# Patient Record
Sex: Female | Born: 1960 | Race: White | Hispanic: No | Marital: Married | Smoking: Former smoker
Health system: Southern US, Community
[De-identification: ages and names within clinical notes are randomized; demographics above are authoritative.]

## PROBLEM LIST (undated history)

## (undated) DIAGNOSIS — E785 Hyperlipidemia, unspecified: Secondary | ICD-10-CM

## (undated) DIAGNOSIS — C50919 Malignant neoplasm of unspecified site of unspecified female breast: Secondary | ICD-10-CM

## (undated) HISTORY — DX: Malignant neoplasm of unspecified site of unspecified female breast: C50.919

## (undated) HISTORY — DX: Hyperlipidemia, unspecified: E78.5

## (undated) HISTORY — PX: OTHER SURGICAL HISTORY: SHX169

## (undated) HISTORY — PX: TONSILLECTOMY: SUR1361

---

## 1994-05-23 HISTORY — PX: MASTECTOMY: SHX3

## 1998-06-25 ENCOUNTER — Encounter: Payer: Self-pay | Admitting: Internal Medicine

## 1998-06-25 ENCOUNTER — Ambulatory Visit (HOSPITAL_COMMUNITY): Admission: RE | Admit: 1998-06-25 | Discharge: 1998-06-25 | Payer: Self-pay | Admitting: Internal Medicine

## 1998-12-30 ENCOUNTER — Other Ambulatory Visit: Admission: RE | Admit: 1998-12-30 | Discharge: 1998-12-30 | Payer: Self-pay | Admitting: *Deleted

## 2000-01-17 ENCOUNTER — Other Ambulatory Visit: Admission: RE | Admit: 2000-01-17 | Discharge: 2000-01-17 | Payer: Self-pay | Admitting: *Deleted

## 2000-01-21 ENCOUNTER — Encounter (INDEPENDENT_AMBULATORY_CARE_PROVIDER_SITE_OTHER): Payer: Self-pay | Admitting: Specialist

## 2000-01-21 ENCOUNTER — Ambulatory Visit (HOSPITAL_COMMUNITY): Admission: RE | Admit: 2000-01-21 | Discharge: 2000-01-21 | Payer: Self-pay | Admitting: *Deleted

## 2001-03-21 ENCOUNTER — Other Ambulatory Visit: Admission: RE | Admit: 2001-03-21 | Discharge: 2001-03-21 | Payer: Self-pay | Admitting: *Deleted

## 2002-04-04 ENCOUNTER — Other Ambulatory Visit: Admission: RE | Admit: 2002-04-04 | Discharge: 2002-04-04 | Payer: Self-pay | Admitting: *Deleted

## 2003-05-29 ENCOUNTER — Other Ambulatory Visit: Admission: RE | Admit: 2003-05-29 | Discharge: 2003-05-29 | Payer: Self-pay | Admitting: *Deleted

## 2003-09-05 ENCOUNTER — Ambulatory Visit (HOSPITAL_COMMUNITY): Admission: RE | Admit: 2003-09-05 | Discharge: 2003-09-05 | Payer: Self-pay | Admitting: *Deleted

## 2003-09-05 ENCOUNTER — Encounter (INDEPENDENT_AMBULATORY_CARE_PROVIDER_SITE_OTHER): Payer: Self-pay | Admitting: Specialist

## 2005-07-13 ENCOUNTER — Ambulatory Visit: Payer: Self-pay | Admitting: Internal Medicine

## 2005-07-28 ENCOUNTER — Ambulatory Visit: Payer: Self-pay | Admitting: Internal Medicine

## 2006-02-23 ENCOUNTER — Ambulatory Visit: Payer: Self-pay | Admitting: Internal Medicine

## 2006-04-06 ENCOUNTER — Ambulatory Visit: Payer: Self-pay | Admitting: Internal Medicine

## 2006-05-05 ENCOUNTER — Ambulatory Visit: Payer: Self-pay | Admitting: Internal Medicine

## 2006-09-20 ENCOUNTER — Encounter: Admission: RE | Admit: 2006-09-20 | Discharge: 2006-09-20 | Payer: Self-pay | Admitting: Otolaryngology

## 2007-09-12 ENCOUNTER — Ambulatory Visit: Payer: Self-pay | Admitting: Family Medicine

## 2007-09-12 LAB — CONVERTED CEMR LAB
Inflenza A Ag: NEGATIVE
Rapid Strep: NEGATIVE

## 2008-02-12 ENCOUNTER — Ambulatory Visit: Payer: Self-pay | Admitting: Internal Medicine

## 2008-02-12 DIAGNOSIS — R61 Generalized hyperhidrosis: Secondary | ICD-10-CM | POA: Insufficient documentation

## 2008-02-12 DIAGNOSIS — R51 Headache: Secondary | ICD-10-CM | POA: Insufficient documentation

## 2008-02-12 DIAGNOSIS — R6883 Chills (without fever): Secondary | ICD-10-CM | POA: Insufficient documentation

## 2008-02-12 DIAGNOSIS — Z853 Personal history of malignant neoplasm of breast: Secondary | ICD-10-CM | POA: Insufficient documentation

## 2008-02-12 DIAGNOSIS — R519 Headache, unspecified: Secondary | ICD-10-CM | POA: Insufficient documentation

## 2008-02-12 LAB — CONVERTED CEMR LAB
Basophils Absolute: 0 10*3/uL (ref 0.0–0.1)
Basophils Relative: 0.7 % (ref 0.0–3.0)
Eosinophils Absolute: 0.1 10*3/uL (ref 0.0–0.7)
Eosinophils Relative: 2.6 % (ref 0.0–5.0)
HCT: 41.1 % (ref 36.0–46.0)
Hemoglobin: 14.4 g/dL (ref 12.0–15.0)
Lymphocytes Relative: 25.5 % (ref 12.0–46.0)
MCHC: 35.1 g/dL (ref 30.0–36.0)
MCV: 97 fL (ref 78.0–100.0)
Monocytes Absolute: 0.3 10*3/uL (ref 0.1–1.0)
Monocytes Relative: 6.9 % (ref 3.0–12.0)
Neutro Abs: 3.1 10*3/uL (ref 1.4–7.7)
Neutrophils Relative %: 64.3 % (ref 43.0–77.0)
Platelets: 273 10*3/uL (ref 150–400)
RBC: 4.23 M/uL (ref 3.87–5.11)
RDW: 12.3 % (ref 11.5–14.6)
WBC: 4.7 10*3/uL (ref 4.5–10.5)

## 2008-02-13 ENCOUNTER — Encounter: Payer: Self-pay | Admitting: Internal Medicine

## 2009-05-07 ENCOUNTER — Encounter: Payer: Self-pay | Admitting: Internal Medicine

## 2009-05-07 DIAGNOSIS — R0989 Other specified symptoms and signs involving the circulatory and respiratory systems: Secondary | ICD-10-CM

## 2009-05-07 DIAGNOSIS — R0609 Other forms of dyspnea: Secondary | ICD-10-CM

## 2009-05-13 ENCOUNTER — Telehealth (INDEPENDENT_AMBULATORY_CARE_PROVIDER_SITE_OTHER): Payer: Self-pay | Admitting: *Deleted

## 2009-05-13 ENCOUNTER — Ambulatory Visit: Payer: Self-pay | Admitting: Internal Medicine

## 2009-10-12 ENCOUNTER — Encounter: Admission: RE | Admit: 2009-10-12 | Discharge: 2009-10-12 | Payer: Self-pay | Admitting: Family Medicine

## 2010-02-11 ENCOUNTER — Encounter: Payer: Self-pay | Admitting: Internal Medicine

## 2010-02-12 ENCOUNTER — Encounter: Payer: Self-pay | Admitting: Internal Medicine

## 2010-02-15 ENCOUNTER — Encounter: Payer: Self-pay | Admitting: Internal Medicine

## 2010-02-15 ENCOUNTER — Encounter (INDEPENDENT_AMBULATORY_CARE_PROVIDER_SITE_OTHER): Payer: Self-pay | Admitting: *Deleted

## 2010-03-11 ENCOUNTER — Encounter (INDEPENDENT_AMBULATORY_CARE_PROVIDER_SITE_OTHER): Payer: Self-pay | Admitting: *Deleted

## 2010-03-15 ENCOUNTER — Ambulatory Visit: Payer: Self-pay | Admitting: Internal Medicine

## 2010-03-29 ENCOUNTER — Telehealth: Payer: Self-pay | Admitting: Gastroenterology

## 2010-03-29 ENCOUNTER — Ambulatory Visit: Payer: Self-pay | Admitting: Internal Medicine

## 2010-04-05 ENCOUNTER — Encounter: Payer: Self-pay | Admitting: Family Medicine

## 2010-04-06 ENCOUNTER — Ambulatory Visit: Payer: Self-pay | Admitting: Internal Medicine

## 2010-04-06 DIAGNOSIS — R5383 Other fatigue: Secondary | ICD-10-CM

## 2010-04-06 DIAGNOSIS — R5381 Other malaise: Secondary | ICD-10-CM

## 2010-04-08 LAB — CONVERTED CEMR LAB
Basophils Relative: 0.4 % (ref 0.0–3.0)
Eosinophils Relative: 0.9 % (ref 0.0–5.0)
HCT: 43.2 % (ref 36.0–46.0)
MCV: 96.7 fL (ref 78.0–100.0)
Monocytes Absolute: 0.5 10*3/uL (ref 0.1–1.0)
Monocytes Relative: 6.8 % (ref 3.0–12.0)
Neutrophils Relative %: 64 % (ref 43.0–77.0)
RBC: 4.47 M/uL (ref 3.87–5.11)
WBC: 7 10*3/uL (ref 4.5–10.5)

## 2010-04-09 ENCOUNTER — Telehealth: Payer: Self-pay | Admitting: Gastroenterology

## 2010-04-12 ENCOUNTER — Ambulatory Visit: Payer: Self-pay | Admitting: Internal Medicine

## 2010-04-21 ENCOUNTER — Encounter: Payer: Self-pay | Admitting: Internal Medicine

## 2010-04-22 ENCOUNTER — Encounter: Payer: Self-pay | Admitting: Internal Medicine

## 2010-04-23 ENCOUNTER — Ambulatory Visit: Payer: Self-pay | Admitting: Internal Medicine

## 2010-04-23 ENCOUNTER — Telehealth (INDEPENDENT_AMBULATORY_CARE_PROVIDER_SITE_OTHER): Payer: Self-pay | Admitting: *Deleted

## 2010-04-27 ENCOUNTER — Ambulatory Visit: Payer: Self-pay | Admitting: Internal Medicine

## 2010-04-27 ENCOUNTER — Encounter: Payer: Self-pay | Admitting: Family Medicine

## 2010-04-28 ENCOUNTER — Encounter: Payer: Self-pay | Admitting: Internal Medicine

## 2010-04-29 ENCOUNTER — Encounter: Payer: Self-pay | Admitting: Internal Medicine

## 2010-04-29 ENCOUNTER — Ambulatory Visit: Payer: Self-pay | Admitting: Internal Medicine

## 2010-04-30 ENCOUNTER — Encounter: Payer: Self-pay | Admitting: Internal Medicine

## 2010-04-30 ENCOUNTER — Ambulatory Visit: Payer: Self-pay | Admitting: Internal Medicine

## 2010-04-30 DIAGNOSIS — F411 Generalized anxiety disorder: Secondary | ICD-10-CM | POA: Insufficient documentation

## 2010-04-30 DIAGNOSIS — E785 Hyperlipidemia, unspecified: Secondary | ICD-10-CM | POA: Insufficient documentation

## 2010-05-05 ENCOUNTER — Ambulatory Visit: Payer: Self-pay | Admitting: Cardiology

## 2010-05-05 ENCOUNTER — Ambulatory Visit: Payer: Self-pay

## 2010-06-22 NOTE — Miscellaneous (Signed)
Summary: Orders Update   Clinical Lists Changes  Orders: Added new Referral order of Radiology Referral (Radiology) - Signed 

## 2010-06-22 NOTE — Miscellaneous (Signed)
Summary: [facility] Pervisit/prep  Clinical Lists Changes  Medications: Added new medication of MOVIPREP 100 GM  SOLR (PEG-KCL-NACL-NASULF-NA ASC-C) As per prep instructions. - Signed Rx of MOVIPREP 100 GM  SOLR (PEG-KCL-NACL-NASULF-NA ASC-C) As per prep instructions.;  #1 x 0;  Signed;  Entered by: Wyona Almas RN;  Authorized by: Rachael Fee MD;  Method used: [facility]  [place] [address] [address], [place], [place]  [address], Ph: [phone] or [phone], Fax: [phone] Observations: Added new observation of NKA: T (03/15/2010 11:00)    Prescriptions: MOVIPREP 100 GM  SOLR (PEG-KCL-NACL-NASULF-NA ASC-C) As per prep instructions.  #1 x 0   Entered by:   Wyona Almas RN   Authorized by:   Rachael Fee MD   Signed by:   Wyona Almas RN on 03/15/2010   Method used:   [facility]        [facility]  [place] [address] (retail)       [address]       [place], [place]  [address]       Ph: [phone] or [phone]       Fax: [phone]   RxID:   [id number]

## 2010-06-22 NOTE — Procedures (Addendum)
Summary: Colonoscopy  Patient: Amy [name] Note: All result statuses are Final unless otherwise noted.  Tests: (1) Colonoscopy (COL)   COL Colonoscopy           DONE (C)     [facility]     [address]     [place], [place]  [address]           COLONOSCOPY PROCEDURE REPORT           PATIENT:  Amy, [name]  MR#:  [id number]     BIRTHDATE:  21-Jul-1960, 49 yrs. old  GENDER:  female     ENDOSCOPIST:  Iva Boop, MD, [facility]     REF. BY:  Marga Melnick, M.D.     PROCEDURE DATE:  04/12/2010     PROCEDURE:  Colonoscopy [address]     ASA CLASS:  Class II     INDICATIONS:  Routine Risk Screening ? of higher risk     father was elderly (in 42's) when colon cancer diagnosed     brothers (2) with polyps in their mid-50's (polyp type not known     by patient)     MEDICATIONS:   Fentanyl 100 mcg IV, Versed 10 mg CORRECTION:     Versed 12 mg           DESCRIPTION OF PROCEDURE:   After the risks benefits and     alternatives of the procedure were thoroughly explained, informed     consent was obtained.  Digital rectal exam was performed and     revealed no abnormalities.   The LB 180AL [id number] endoscope was     introduced through the anus and advanced to the cecum, which was     identified by both the appendix and ileocecal valve, without     limitations.  The quality of the prep was excellent, using     MoviPrep.  The instrument was then slowly withdrawn as the colon     was fully examined. Insertion: 7:20 minutes Withdrawal: 6:36     minutes     <<PROCEDUREIMAGES>>           FINDINGS:  A normal appearing cecum, ileocecal valve, and     appendiceal orifice were identified. The ascending, hepatic     flexure, transverse, splenic flexure, descending, sigmoid colon,     and rectum appeared unremarkable.   Retroflexed views in the     rectum revealed internal and external hemorrhoids.  Small.  The     scope was then withdrawn from the patient and the procedure  completed.           COMPLICATIONS:  None     ENDOSCOPIC IMPRESSION:     1) Normal colon     2) Internal and external hemorrhoids in the rectum     3) Excellent prep           REPEAT EXAM:  In 5 year(s) for routine screening colonoscopy.  Try     to sort out details of brothers polyps before next colonoscopy.     She may not need routine repeat exam in 5 years but need to think     about it then.           Iva Boop, MD, [facility]           CC:  Pecola Lawless, MD and The Patient           n.  REVISED:  04/27/2010 03:50 PM     eSIGNED:   Iva Boop at 04/27/2010 03:50 PM           Lorn Junes, Gae Dry, [phone]  Note: An exclamation mark (!) indicates a result that was not dispersed into the flowsheet. Document Creation Date: 04/27/2010 3:50 PM _______________________________________________________________________  (1) Order result status: Final Collection or observation date-time: 04/12/2010 12:51 Requested date-time:  Receipt date-time:  Reported date-time:  Referring Physician:   Ordering Physician: Stan Head [phone]) Specimen Source:  Source: Launa Grill Order Number: [id number] Lab site:

## 2010-06-22 NOTE — Miscellaneous (Signed)
Summary: Orders Update  Clinical Lists Changes  Medications: Added new medication of PROAIR HFA 108 (90 BASE) MCG/ACT AERS (ALBUTEROL SULFATE) 1-2 puffs every 4 hrs as needed - Signed Added new medication of PREDNISONE 20 MG TABS (PREDNISONE) 1/2 three times a day with meals - Signed Rx of PROAIR HFA 108 (90 BASE) MCG/ACT AERS (ALBUTEROL SULFATE) 1-2 puffs every 4 hrs as needed;  #1 x 2;  Signed;  Entered by: Marga Melnick MD;  Authorized by: Marga Melnick MD;  Method used: Print then Give to Patient Rx of PREDNISONE 20 MG TABS (PREDNISONE) 1/2 three times a day with meals;  #12 x 0;  Signed;  Entered by: Marga Melnick MD;  Authorized by: Marga Melnick MD;  Method used: Print then Give to Patient Orders: Added new Referral order of Misc. Referral (Misc. Ref) - Signed Added new Test order of T-2 View CXR (71020TC) - Signed    Prescriptions: PREDNISONE 20 MG TABS (PREDNISONE) 1/2 three times a day with meals  #12 x 0   Entered and Authorized by:   Marga Melnick MD   Signed by:   Marga Melnick MD on 04/22/2010   Method used:   Print then Give to Patient   RxID:   [id number] PROAIR HFA 108 (90 BASE) MCG/ACT AERS (ALBUTEROL SULFATE) 1-2 puffs every 4 hrs as needed  #1 x 2   Entered and Authorized by:   Marga Melnick MD   Signed by:   Marga Melnick MD on 04/22/2010   Method used:   Print then Give to Patient   RxID:   [id number]

## 2010-06-22 NOTE — Letter (Signed)
Summary: E Mail from Patient Regarding Colonoscopy  E Mail from Patient Regarding Colonoscopy   Imported By: Lanelle Bal 02/22/2010 09:04:49  _____________________________________________________________________  External Attachment:    Type:   Image     Comment:   External Document

## 2010-06-22 NOTE — Assessment & Plan Note (Signed)
Summary: FOLLOWUP ON BREATHING ISSUE/KN   Vital Signs:  Patient profile:   50 year old female Height:      68 inches (172.72 cm) Weight:      150.25 pounds (68.30 kg) BMI:     22.93 Temp:     98.6 degrees F (37.00 degrees C) oral Resp:     155 per minute BP sitting:   122 / 78  (right arm) Cuff size:   regular  Vitals Entered By: Lucious Groves CMA (April 30, 2010 1:58 PM) CC: Discuss breathing problems and recent testing./kb, COPD follow-up   Primary Care Provider:  Alwyn Ren  CC:  Discuss breathing problems and recent testing./kb and COPD follow-up.  History of Present Illness:      This is a 50 year old woman who presents for DYSPNEA & COUGH  follow-up.  The patient reports shortness of breath & fatigue , chest tightness, cough, increased sputum, nocturnal awakening, and exercise induced symptoms, but denies wheezing and heat intolerance.  Medication use includes quick relief med rarely and controller med daily. PRIOR TO SYMPTOMS SHE WAS EXERCISING 5-6 X/ WEEK . PFTs & CT scan reviewed; both normal.      The patient reports resting chest pain, but denies exertional chest pain, nausea, vomiting, diaphoresis, palpitations, dizziness, light headedness, syncope, and indigestion.  The pain is described as dull.  The pain is located in the substernal area.  The pain radiates to the back.  Episodes of chest pain last 1-2 minutes. PGF, PGM & M aunt had  MI. She is pre menopausal ; she has dyslipidemia which has been assessed by Dr Chip watkins..   Current Medications (verified): 1)  Armour Thyroid 15 Mg Tabs (Thyroid) .Marland Kitchen.. 1 By Mouth Qd 2)  Fluticasone Propionate 50 Mcg/act Susp (Fluticasone Propionate) .Marland Kitchen.. 1 Spray Two Times A Day 3)  Singulair 10 Mg Tabs (Montelukast Sodium) .Marland Kitchen.. 1 Once Daily As Needed 4)  Dulera 200-5 Mcg/act Aero (Mometasone Furo-Formoterol Fum) .Marland Kitchen.. 1-2 Puffs Every 12 Hrs ; Gargle & Spit  After Use 5)  Proair Hfa 108 (90 Base) Mcg/act Aers (Albuterol Sulfate) .Marland Kitchen.. 1-2  Puffs Every 4 Hrs As Needed 6)  Prednisone 20 Mg Tabs (Prednisone) .... 1/2 Three Times A Day With Meals  Allergies (verified): No Known Drug Allergies  Past History:  Past Medical History: Breast cancer, hx of Hyperlipidemia  Review of Systems ENT:  Complains of hoarseness; denies difficulty swallowing. GI:  Denies bloody stools and dark tarry stools; No dysphagia. Psych:  Complains of anxiety, depression, easily angered, easily tearful, and irritability; some famiy stress; her father died with CVA recently.  Physical Exam  General:  well-nourished,in no acute distress; alert,appropriate and cooperative throughout examination Mouth:  Oral mucosa and oropharynx without lesions or exudates.  Teeth in good repair. No pharyngeal erythema.   Lungs:  Normal respiratory effort, chest expands symmetrically. Lungs are clear to auscultation, no crackles or wheezes. Heart:  Normal rate and regular rhythm. S1 and S2 normal without gallop, murmur, click, rub.S4 with slurring  Abdomen:  Bowel sounds positive,abdomen soft and non-tender without masses, organomegaly or hernias noted. Pulses:  R and L carotid,radial,dorsalis pedis and posterior tibial pulses are full and equal bilaterally Extremities:  No clubbing, cyanosis, edema. Neurologic:  alert & oriented X3.   Skin:  Intact without suspicious lesions or rashes Cervical Nodes:  No lymphadenopathy noted Axillary Nodes:  No palpable lymphadenopathy Psych:  memory intact for recent and remote, normally interactive, good eye contact, not anxious  appearing, and not depressed appearing.     Impression & Recommendations:  Problem # 1:  DYSPNEA/SHORTNESS OF BREATH (ICD-786.09)  R/O angina variant Her updated medication list for this problem includes:    Singulair 10 Mg Tabs (Montelukast sodium) .Marland Kitchen... 1 once daily as needed    Dulera 200-5 Mcg/act Aero (Mometasone furo-formoterol fum) .Marland Kitchen... 1-2 puffs every 12 hrs ; gargle & spit  after use     Proair Hfa 108 (90 Base) Mcg/act Aers (Albuterol sulfate) .Marland Kitchen... 1-2 puffs every 4 hrs as needed  Orders: Misc. Referral (Misc. Ref) EKG w/ Interpretation ([address])  Problem # 2:  FATIGUE (ICD-780.79)  Orders: Misc. Referral (Misc. Ref) EKG w/ Interpretation ([address])  Problem # 3:  HYPERLIPIDEMIA (ICD-272.4) Advanced Lipid Testing indicated if not done to date by Dr Corine Shelter Orders: Misc. Referral (Misc. Ref) EKG w/ Interpretation ([address])  Problem # 4:  ANXIETY STATE, UNSPECIFIED (ICD-300.00)  role of Neurotransmitter deficiency reviewed  Her updated medication list for this problem includes:    Citalopram Hydrobromide 20 Mg Tabs (Citalopram hydrobromide) .Marland Kitchen... 1 once daily  Orders: EKG w/ Interpretation ([address])  Complete Medication List: 1)  Armour Thyroid 15 Mg Tabs (Thyroid) .Marland Kitchen.. 1 by mouth qd 2)  Fluticasone Propionate 50 Mcg/act Susp (Fluticasone propionate) .Marland Kitchen.. 1 spray two times a day 3)  Singulair 10 Mg Tabs (Montelukast sodium) .Marland Kitchen.. 1 once daily as needed 4)  Dulera 200-5 Mcg/act Aero (Mometasone furo-formoterol fum) .Marland Kitchen.. 1-2 puffs every 12 hrs ; gargle & spit  after use 5)  Proair Hfa 108 (90 Base) Mcg/act Aers (Albuterol sulfate) .Marland Kitchen.. 1-2 puffs every 4 hrs as needed 6)  Prednisone 20 Mg Tabs (Prednisone) .... 1/2 three times a day with meals 7)  Citalopram Hydrobromide 20 Mg Tabs (Citalopram hydrobromide) .Marland Kitchen.. 1 once daily  Patient Instructions: 1)  Hold exercise until Stress Test. Prescriptions: CITALOPRAM HYDROBROMIDE 20 MG TABS (CITALOPRAM HYDROBROMIDE) 1 once daily  #30 x 1   Entered and Authorized by:   Marga Melnick MD   Signed by:   Marga Melnick MD on 04/30/2010   Method used:   Print then Give to Patient   RxID:   [id number]    Orders Added: 1)  Est. Patient Level IV [[address]782] 2)  Misc. Referral [Misc. Ref] 3)  EKG w/ Interpretation [93[address]]

## 2010-06-22 NOTE — Assessment & Plan Note (Signed)
Summary: STILL W/COUGH/RH..........Amy [name]   Vital Signs:  Patient profile:   50 year old female Weight:      146.2 pounds BMI:     22.31 O2 Sat:      99 % on Room air Temp:     98.2 degrees F oral Pulse rate:   71 / minute Resp:     15 per minute BP sitting:   110 / 72  (left arm) Cuff size:   regular  Vitals Entered By: Shonna Chock CMA (April 06, 2010 1:49 PM)  O2 Flow:  Room air CC: Ongoing cough-productive, ear pain, and SOB, URI symptoms   Primary Care Provider:  Alwyn Ren  CC:  Ongoing cough-productive, ear pain, and SOB, and URI symptoms.  History of Present Illness: RTI Symptoms      This is a 50 year old woman who presents with  RTI  symptoms despite 9 days of Augmentin .  The patient reports nasal congestion, minimal purulent nasal discharge, sore throat, and dry cough, but denies earache.  Associated symptoms include dyspnea.  The patient denies fever and wheezing.  PMH of asthma.The patient denies headache  & bilateral facial pain, tooth pain, and tender adenopathy.   Some loose stool & frank diarrhea.  Current Medications (verified): 1)  Moviprep 100 Gm  Solr (Peg-Kcl-Nacl-Nasulf-Na Asc-C) .... As Per Prep Instructions. 2)  Armour Thyroid 15 Mg Tabs (Thyroid) .Amy [name].. 1 By Mouth Qd 3)  Fluticasone Propionate 50 Mcg/act Susp (Fluticasone Propionate) .Amy [name].. 1 Spray Two Times A Day 4)  Amoxicillin-Pot Clavulanate 875-125 Mg Tabs (Amoxicillin-Pot Clavulanate) .Amy [name].. 1 Every 12 Hrs With A Meal 5)  Singulair 10 Mg Tabs (Montelukast Sodium) .Amy [name].. 1 Once Daily As Needed  Allergies (verified): No Known Drug Allergies  Physical Exam  General:  well-nourished,in no acute distress; alert,appropriate and cooperative throughout examination Ears:  External ear exam shows no significant lesions or deformities.  Otoscopic examination reveals clear canals, tympanic membranes are intact bilaterally without bulging, retraction, inflammation or discharge. Hearing is grossly normal  bilaterally. Nose:  External nasal examination shows no deformity or inflammation. Nasal mucosa are pink and moist without lesions or exudates. Mouth:  Oral mucosa and oropharynx without lesions or exudates.  Teeth in good repair. Lungs:  Normal respiratory effort, chest expands symmetrically. Lungs are  essentially clear to auscultation, MINIMAL  crackles  w/o wheezes. Heart:  Normal rate and regular rhythm. S1 and S2 normal without gallop, murmur, click, rub or other extra sounds. Extremities:  No clubbing, cyanosis, edema. Cervical Nodes:  No lymphadenopathy noted Axillary Nodes:  No palpable lymphadenopathy   Impression & Recommendations:  Problem # 1:  BRONCHITIS-ACUTE (ICD-466.0)  RAD component by history; PMH of asthma The following medications were removed from the medication list:    Amoxicillin-pot Clavulanate 875-125 Mg Tabs (Amoxicillin-pot clavulanate) .Amy [name]... 1 every 12 hrs with a meal Her updated medication list for this problem includes:    Singulair 10 Mg Tabs (Montelukast sodium) .Amy [name]... 1 once daily as needed    Azithromycin 250 Mg Tabs (Azithromycin) .Amy [name]... As per pack    Dulera 200-5 Mcg/act Aero (Mometasone furo-formoterol fum) .Amy [name]... 1-2 puffs every 12 hrs ; gargle & spit  after use  Orders: Venipuncture ([address]) TLB-CBC Platelet - w/Differential (85025-CBCD)  Problem # 2:  FATIGUE (ICD-780.79)  Orders: Venipuncture (98119) TLB-CBC Platelet - w/Differential (85025-CBCD)  Complete Medication List: 1)  Moviprep 100 Gm Solr (Peg-kcl-nacl-nasulf-na asc-c) .... As per prep instructions. 2)  Armour Thyroid 15 Mg Tabs (Thyroid) .Amy [name].. 1 by  mouth qd 3)  Fluticasone Propionate 50 Mcg/act Susp (Fluticasone propionate) .Amy [name].. 1 spray two times a day 4)  Singulair 10 Mg Tabs (Montelukast sodium) .Amy [name].. 1 once daily as needed 5)  Azithromycin 250 Mg Tabs (Azithromycin) .... As per pack 6)  Dulera 200-5 Mcg/act Aero (Mometasone furo-formoterol fum) .Amy [name].. 1-2 puffs every 12 hrs ;  gargle & spit  after use  Patient Instructions: 1)  Align once daily unti bowels are normal. Prescriptions: DULERA 200-5 MCG/ACT AERO (MOMETASONE FURO-FORMOTEROL FUM) 1-2 puffs every 12 hrs ; gargle & spit  after use  #1 x 5   Entered and Authorized by:   Marga Melnick MD   Signed by:   Marga Melnick MD on 04/06/2010   Method used:   Print then Give to Patient   RxID:   [id number] AZITHROMYCIN 250 MG TABS (AZITHROMYCIN) as per pack  #1 x 0   Entered and Authorized by:   Marga Melnick MD   Signed by:   Marga Melnick MD on 04/06/2010   Method used:   [facility] ...       [facility]  [place] [address] ([facility]ail)       [address]       [place], [place]  [address]       Ph: [phone] or [phone]       Fax: [phone]   RxID:   [id number]    Orders Added: 1)  Est. Patient Level III [66063] 2)  Venipuncture [01601] 3)  TLB-CBC Platelet - w/Differential [85025-CBCD]  Appended Document: STILL W/COUGH/RH..........Amy [name]

## 2010-06-22 NOTE — Progress Notes (Signed)
Summary: Colon Mon 11-21  Phone Note Call from Patient Call back at Work Phone [phone]   Call For: Dr Christella Hartigan Summary of Call: Has been sick and taking alot of diffrent medications. Wants to make sure the things she took are not going to mess her up for Procedure on Monday 04-12-10. She does feel better. Initial call taken by: Leanor Kail [employer],  April 09, 2010 12:04 PM  Follow-up for Phone Call        On anitibiotics for broncitis. Wants to know if she can still have procedure. States has ran no fever and is much better.  Explained she should be fine but if symptoms get worse or change and if starts running a fever to give [place] a call. Follow-up by: Laverna Peace RN,  April 09, 2010 3:42 PM

## 2010-06-22 NOTE — Letter (Signed)
Summary: Pre Visit Letter Revised  [facility]  [address] [place], [place] [address]   Phone: [phone]  Fax: [phone]        02/15/2010 MRN: [id number] [facility] [address] [place], [place]  [address]             Procedure Date:  03-29-10   Welcome to the Gastroenterology Division at [facility].    You are scheduled to see a nurse for your pre-procedure visit on 03-15-10 at 11:00a.m. on the 3rd floor at [facility], [address]  We ask that you try to arrive at our office 15 minutes prior to your appointment time to allow for check-in.  Please take a minute to review the attached form.  If you answer "Yes" to one or more of the questions on the first page, we ask that you call the person listed at your earliest opportunity.  If you answer "No" to all of the questions, please complete the rest of the form and bring it to your appointment.    Your nurse visit will consist of discussing your medical and surgical history, your immediate family medical history, and your medications.   If you are unable to list all of your medications on the form, please bring the medication bottles to your appointment and we will list them.  We will need to be aware of both prescribed and over the counter drugs.  We will need to know exact dosage information as well.    Please be prepared to read and sign documents such as consent forms, a financial agreement, and acknowledgement forms.  If necessary, and with your consent, a friend or relative is welcome to sit-in on the nurse visit with you.  Please bring your insurance card so that we may make a copy of it.  If your insurance requires a referral to see a specialist, please bring your referral form from your primary care physician.  No co-pay is required for this nurse visit.     If you cannot keep your appointment, please call [phone] to cancel or reschedule prior to your appointment date.  This  allows [place] the opportunity to schedule an appointment for another patient in need of care.    Thank you for choosing [facility] for your medical needs.  We appreciate the opportunity to care for you.  Please visit [place] at our website  to learn more about our practice.  Sincerely, The Gastroenterology Division

## 2010-06-22 NOTE — Assessment & Plan Note (Signed)
Summary: FOR CONGESTION AND OTHER//PH   Vital Signs:  Patient profile:   50 year old female Height:      68 inches (172.72 cm) Weight:      146.25 pounds (66.48 kg) BMI:     22.32 O2 Sat:      99 % on Room air Temp:     98.4 degrees F (36.89 degrees C) oral Pulse rate:   79 / minute Resp:     15 per minute BP sitting:   116 / 80  (left arm) Cuff size:   regular  Vitals Entered By: Lucious Groves CMA (March 29, 2010 12:27 PM)  O2 Flow:  Room air CC: Possible sinus infection./kb, URI symptoms Is Patient Diabetic? No Pain Assessment Patient in pain? no      Comments Patient notes that she thought it was allergies, she has been having cough with yellow mucous production, sore throat, congestion causing chest tightness, fever, and nausea. She denies vomiting and chest pain. Symptoms first presented about a week ago. Mucinex has been little help and possibly has caused GI upset. /kb   Primary Care Larnell Granlund:  Alwyn Ren  CC:  Possible sinus infection./kb and URI symptoms.  History of Present Illness: RTI  Symptoms      This is a 50 year old woman who presents with RTI symptoms since 10/31 as extrinsic symptoms.  As of 11/04 the patient reports nasal congestion, purulent nasal discharge, productive cough with yellow sputum, and earache ( pressure).  Associated symptoms include low-grade fever (<100.5 degrees).  The patient denies dyspnea and wheezing.  The patient also reports itchy throat and sneezing.  The patient denies headache.  Risk factors for Strep sinusitis include bilateral facial pain.  The patient denies the following risk factors for Strep sinusitis: tooth pain and tender adenopathy.    Current Medications (verified): 1)  Moviprep 100 Gm  Solr (Peg-Kcl-Nacl-Nasulf-Na Asc-C) .... As Per Prep Instructions. 2)  Armour Thyroid 15 Mg Tabs (Thyroid) .Marland Kitchen.. 1 By Mouth Qd  Allergies (verified): No Known Drug Allergies  Physical Exam  General:  well-nourished,in no acute distress;  alert,appropriate and cooperative throughout examination Ears:  External ear exam shows no significant lesions or deformities.  Otoscopic examination reveals clear canals, tympanic membranes are intact bilaterally without bulging, retraction, inflammation or discharge. Hearing is grossly normal bilaterally. Nose:  External nasal examination shows no deformity or inflammation. Nasal mucosa are  dry without lesions or exudates. Mouth:  Oral mucosa and oropharynx without lesions or exudates.  Teeth in good repair. Lungs:  Normal respiratory effort, chest expands symmetrically. Lungs are clear to auscultation, no crackles or wheezes. Heart:  Normal rate and regular rhythm. S1 and S2 normal without gallop, murmur, click, rub .S4 Cervical Nodes:  No lymphadenopathy noted Axillary Nodes:  No palpable lymphadenopathy   Impression & Recommendations:  Problem # 1:  BRONCHITIS-ACUTE (ICD-466.0)  The following medications were removed from the medication list:    Cefuroxime Axetil 500 Mg Tabs (Cefuroxime axetil) .Marland Kitchen... 1 two times a day Her updated medication list for this problem includes:    Amoxicillin-pot Clavulanate 875-125 Mg Tabs (Amoxicillin-pot clavulanate) .Marland Kitchen... 1 every 12 hrs with a meal    Singulair 10 Mg Tabs (Montelukast sodium) .Marland Kitchen... 1 once daily as needed  Problem # 2:  SINUSITIS- ACUTE-NOS (ICD-461.9)  The following medications were removed from the medication list:    Cefuroxime Axetil 500 Mg Tabs (Cefuroxime axetil) .Marland Kitchen... 1 two times a day Her updated medication list for this  problem includes:    Fluticasone Propionate 50 Mcg/act Susp (Fluticasone propionate) .Marland Kitchen... 1 spray two times a day    Amoxicillin-pot Clavulanate 875-125 Mg Tabs (Amoxicillin-pot clavulanate) .Marland Kitchen... 1 every 12 hrs with a meal  Complete Medication List: 1)  Moviprep 100 Gm Solr (Peg-kcl-nacl-nasulf-na asc-c) .... As per prep instructions. 2)  Armour Thyroid 15 Mg Tabs (Thyroid) .Marland Kitchen.. 1 by mouth qd 3)   Fluticasone Propionate 50 Mcg/act Susp (Fluticasone propionate) .Marland Kitchen.. 1 spray two times a day 4)  Amoxicillin-pot Clavulanate 875-125 Mg Tabs (Amoxicillin-pot clavulanate) .Marland Kitchen.. 1 every 12 hrs with a meal 5)  Singulair 10 Mg Tabs (Montelukast sodium) .Marland Kitchen.. 1 once daily as needed  Patient Instructions: 1)  Use Singulair 10 mg once daily as needed for cough or allergic symptoms. 2)  Drink as much  NON dairy fluid as you can tolerate for the next few days. Prescriptions: SINGULAIR 10 MG TABS (MONTELUKAST SODIUM) 1 once daily as needed  #30 x 11   Entered and Authorized by:   Marga Melnick MD   Signed by:   Marga Melnick MD on 03/29/2010   Method used:   Samples Given   RxID:   [id number] AMOXICILLIN-POT CLAVULANATE 875-125 MG TABS (AMOXICILLIN-POT CLAVULANATE) 1 every 12 hrs with a meal  #20 x 0   Entered and Authorized by:   Marga Melnick MD   Signed by:   Marga Melnick MD on 03/29/2010   Method used:   Faxed to ...       [facility]  [place] [address] (retail)       [address]       [place], [place]  [address]       Ph: [phone] or [phone]       Fax: [phone]   RxID:   [id number] FLUTICASONE PROPIONATE 50 MCG/ACT SUSP (FLUTICASONE PROPIONATE) 1 spray two times a day  #1 x 11   Entered and Authorized by:   Marga Melnick MD   Signed by:   Marga Melnick MD on 03/29/2010   Method used:   [facility]axed to ...       [facility]  [place] [address] (retail)       [address]       [place], [place]  [address]       Ph: [phone] or [phone]       Fax: [phone]   RxID:   [id number]    Orders Added: 1)  Est. Patient Level III [62831]

## 2010-06-22 NOTE — Miscellaneous (Signed)
Summary: Orders Update pft charges  Clinical Lists Changes  Orders: Added new Service order of Carbon Monoxide diffusing w/capacity (94720) - Signed Added new Service order of Lung Volumes (94240) - Signed Added new Service order of Spirometry (Pre & Post) (94060) - Signed 

## 2010-06-22 NOTE — Miscellaneous (Signed)
Summary: Orders Update  Clinical Lists Changes  Problems: Added new problem of SCREENING, COLON CANCER (ICD-V76.51) Added new problem of NEOPLASM, MALIGNANT, COLON, FAMILY HX, FATHER (ICD-V16.0) Added new problem of FAMILY HISTORY COLONIC POLYPS (ICD-V18.51) Orders: Added new Referral order of Gastroenterology Referral (GI) - Signed

## 2010-06-22 NOTE — Progress Notes (Signed)
Summary: Resch'd COL  Phone Note Call from Patient   Caller: Patient Call For: Dr. Christella Hartigan Reason for Call: Talk to Nurse Summary of Call: Pt. reschedule her COL from 03-29-10 to 05-28-10 b/c she said she called in yesterday and left message b/c she is sick. Would you like pt. charged the cancelation fee? Initial call taken by: Karna Christmas,  March 29, 2010 8:20 AM  Follow-up for Phone Call        no, not as long as she rescedules Follow-up by: Rachael Fee MD,  March 29, 2010 8:33 AM  Additional Follow-up for Phone Call Additional follow up Details #1::        Patient NOT BILLED. Additional Follow-up by: Leanor Kail [facility],  March 31, 2010 1:52 PM

## 2010-06-22 NOTE — Miscellaneous (Signed)
Summary: [facility]  [facility]   Imported By: Lanelle Bal 02/26/2010 13:15:29  _____________________________________________________________________  External Attachment:    Type:   Image     Comment:   External Document

## 2010-06-22 NOTE — Letter (Signed)
Summary: [facility] Instructions  [facility]  [address] [place], [place] [address]   Phone: [phone]  Fax: [phone]       Amy [name]    Nov 16, 1960    MRN: [id number]        Procedure Day Dorna Bloom:  [facility]  03/29/10     Arrival Time:  10:00AM     Procedure Time:  11:00AM     Location of Procedure:                    _X _  [facility] (4th Floor)  PREPARATION FOR COLONOSCOPY WITH MOVIPREP   Starting 5 days prior to your procedure 03/24/10 do not eat nuts, seeds, popcorn, corn, beans, peas,  salads, or any raw vegetables.  Do not take any fiber supplements (e.g. Metamucil, Citrucel, and Benefiber).  THE DAY BEFORE YOUR PROCEDURE         DATE: 03/28/10   DAY: SUNDAY  1.  Drink clear liquids the entire day-NO SOLID FOOD  2.  Do not drink anything colored red or purple.  Avoid juices with pulp.  No orange juice.  3.  Drink at least 64 oz. (8 glasses) of fluid/clear liquids during the day to prevent dehydration and help the prep work efficiently.  CLEAR LIQUIDS INCLUDE: Water Jello Ice Popsicles Tea (sugar ok, no milk/cream) Powdered fruit flavored drinks Coffee (sugar ok, no milk/cream) Gatorade Juice: apple, white grape, white cranberry  Lemonade Clear bullion, consomm, broth Carbonated beverages (any kind) Strained chicken noodle soup Hard Candy                             4.  In the morning, mix first dose of MoviPrep solution:    Empty 1 Pouch A and 1 Pouch B into the disposable container    Add lukewarm drinking water to the top line of the container. Mix to dissolve    Refrigerate (mixed solution should be used within 24 hrs)  5.  Begin drinking the prep at 5:00 p.m. The MoviPrep container is divided by 4 marks.   Every 15 minutes drink the solution down to the next mark (approximately 8 oz) until the full liter is complete.   6.  Follow completed prep with 16 oz of clear liquid of your choice (Nothing red or purple).   Continue to drink clear liquids until bedtime.  7.  Before going to bed, mix second dose of MoviPrep solution:    Empty 1 Pouch A and 1 Pouch B into the disposable container    Add lukewarm drinking water to the top line of the container. Mix to dissolve    Refrigerate  THE DAY OF YOUR PROCEDURE      DATE: 03/29/10   DAY: MONDAY  Beginning at 6:00AM (5 hours before procedure):         1. Every 15 minutes, drink the solution down to the next mark (approx 8 oz) until the full liter is complete.  2. Follow completed prep with 16 oz. of clear liquid of your choice.    3. You may drink clear liquids until 9:00AM (2 HOURS BEFORE PROCEDURE).   MEDICATION INSTRUCTIONS  Unless otherwise instructed, you should take regular prescription medications with a small sip of water   as early as possible the morning of your procedure.       OTHER INSTRUCTIONS  You will need a responsible adult at  least 51 years of age to accompany you and drive you home.   This person must remain in the waiting room during your procedure.  Wear loose fitting clothing that is easily removed.  Leave jewelry and other valuables at home.  However, you may wish to bring a book to read or  an iPod/MP3 player to listen to music as you wait for your procedure to start.  Remove all body piercing jewelry and leave at home.  Total time from sign-in until discharge is approximately 2-3 hours.  You should go home directly after your procedure and rest.  You can resume normal activities the  day after your procedure.  The day of your procedure you should not:   Drive   Make legal decisions   Operate machinery   Drink alcohol   Return to work  You will receive specific instructions about eating, activities and medications before you leave.    The above instructions have been reviewed and explained to me by   _______________________    I fully understand and can verbalize these instructions  _____________________________ Date _________

## 2010-06-22 NOTE — Progress Notes (Signed)
Summary: prior auth APPROVED SINGULAIR [employer]  Phone Note Refill Request Call back at [phone] Message from:  Fax from Pharmacy on April 23, 2010 3:17 PM  Refills Requested: Medication #1:  SINGULAIR 10 MG TABS 1 once daily as needed [facility] Berkley Harvey  [id number] - id [id number]  Initial call taken by: Okey Regal Spring,  April 23, 2010 3:18 PM  Follow-up for Phone Call        ref [phone]. awaiting fax...Marland KitchenMarland KitchenFelecia Deloach CMA  April 26, 2010 2:13 PM  prior auth faxed back awaiting response..........Marland KitchenFelecia Deloach CMA  April 26, 2010 3:28 PM   Prior auth Approved 04-26-10 until 01-19-13. approval letter scan to chart, PHARMACY FAXED....Marland KitchenMarland KitchenFelecia Deloach CMA  April 28, 2010 2:18 PM

## 2010-06-24 NOTE — Letter (Signed)
Summary: E Mail from Patient with Concerns  E Mail from Patient with Concerns   Imported By: Lanelle Bal 05/03/2010 11:59:37  _____________________________________________________________________  External Attachment:    Type:   Image     Comment:   External Document

## 2010-06-24 NOTE — Medication Information (Signed)
Summary: Prior Authorization & Approval for Singulair/[employer]  Prior Authorization & Approval for Singulair/[employer]   Imported By: Lanelle Bal 05/04/2010 11:00:33  _____________________________________________________________________  External Attachment:    Type:   Image     Comment:   External Document

## 2010-07-13 ENCOUNTER — Encounter: Payer: Self-pay | Admitting: Internal Medicine

## 2010-09-29 ENCOUNTER — Ambulatory Visit (INDEPENDENT_AMBULATORY_CARE_PROVIDER_SITE_OTHER): Payer: BC Managed Care – PPO | Admitting: Internal Medicine

## 2010-09-29 ENCOUNTER — Encounter: Payer: Self-pay | Admitting: Internal Medicine

## 2010-09-29 VITALS — BP 112/76 | HR 76 | Temp 98.2°F | Wt 150.6 lb

## 2010-09-29 DIAGNOSIS — J029 Acute pharyngitis, unspecified: Secondary | ICD-10-CM

## 2010-09-29 LAB — POCT RAPID STREP A (OFFICE): Rapid Strep A Screen: NEGATIVE

## 2010-09-29 NOTE — Progress Notes (Signed)
  Subjective:    Patient ID: Amy [name], female    DOB: 1960-09-24, 50 y.o.   MRN: [id number]  HPI The CENTOR criteria for pharyngitis which include fever, pharyngeal exudate, tender cervical lymphadenopathy, and absence of cough were assessed. She has had a ST for 2 days; she has had arthralgias X 48 hrs w/o fever or tender  LA.The major and minor symptoms of rhinosinusitis were reviewed. These include nasal congestion/obstruction; nasal purulence; facial pain; anosmia; fatigue; fever; headache; halitosis; earache and dental pain. She has some facial pressure & some dental pain , especially in R upper teeth. Rx: Mucinex, Nyquil.     Review of Systems     Objective:   Physical Exam General appearance is of good health and nourishment; no acute distress or increased work of breathing is present.  No  lymphadenopathy about the head, neck, or axilla noted.   Eyes: No conjunctival inflammation or lid edema is present. There is no scleral icterus.  Ears:  External ear exam shows no significant lesions or deformities.  Otoscopic examination reveals clear canals, tympanic membranes are intact bilaterally without bulging, retraction, inflammation or discharge.  Nose:  External nasal examination shows no deformity or inflammation. Nasal mucosa are pink and moist without lesions or exudates. No septal dislocation or dislocation.No obstruction to airflow.   Oral exam: Dental hygiene is good; lips and gums are healthy appearing.There is no oropharyngeal exudate noted.  There is mild erythema & edema.  Neck:  No deformities, thyromegaly, masses, or tenderness noted.    Heart:  Normal rate and regular rhythm. S1 and S2 normal without gallop, murmur, click, rub or other extra sounds.   Lungs:Chest clear to auscultation; no wheezes, rhonchi,rales ,or rubs present.No increased work of breathing.    Skin: Warm & dry w/o jaundice or tenting.          Assessment & Plan:  #1 pharyngitis; negative  beta strep after 40 hours. The Centor criteria for strep pharyngitis not present.  Plan: Neti pot and intranasal steroids for nasal congestion. Zicam Melts, Echinacea, and vitamin C. She is to report the onset of symptoms of rhinosinusitis in the next 48-72 hours.

## 2010-09-29 NOTE — Patient Instructions (Signed)
Prophylactic measures as discussed.

## 2010-10-08 NOTE — H&P (Signed)
[facility]  Patient:    Amy [name], Amy [name]                     MRN: [id number] Adm. Date:  01/21/00 Attending:  Sung Amabile. Roslyn Smiling, M.D.                         History and Physical  DATE OF BIRTH:                11-11-1960  CHIEF COMPLAINT:              First trimester pregnancy, request for termination.  HISTORY OF PRESENT ILLNESS:   A 50 year old woman, G3, P2-0-1-2, previously using no contraception, with a history of breast carcinoma, last menstrual period November 12, 1999, admitted at 9+ weeks gestational age for pregnancy termination.  The patient has been counseled regarding the benefits, risks, and options to this procedure prior to surgery.  PAST MEDICAL HISTORY:         Breast carcinoma diagnosed in 1996.  PAST SURGICAL HISTORY:        Left mastectomy in 1996.  ALLERGIES:                    None.  MEDICATIONS:                  Alprazolam 0.25 mg p.r.n.  OBSTETRICAL HISTORY:          Vaginal deliveries x 2 and one miscarriage.  FAMILY HISTORY:               Father with hypertension, maternal grandfather with lung carcinoma.  SOCIAL HISTORY:               Married.  Works in the [employer] business.  Denies tobacco use.  Drinks alcohol socially.  PHYSICAL EXAMINATION:  GENERAL:                      Healthy-appearing woman.  HEENT:                        Within normal limits.  NECK:                         Without thyromegaly.  CHEST:                        Clear.  COR:                          Regular rate and rhythm.  S1, S2 normal.  BREASTS:                      Status post mastectomy and TRAM flap.  No breast masses, axillary, or supraclavicular nodes.  ABDOMEN:                      Soft, nontender, well-healed surgical scars.  No hepatosplenomegaly or mass.  BACK:                         Without CVAT.  GU:                           External genitalia, BUS, vagina without  lesions. Cervix without  lesions.  Uterus mid to retroflex, 8 to 10 weeks size, nontender and mobile.  Adnexa normal to palpation.  EXTREMITIES:                  Without CCE.  SKIN:                         Without lesions.  NEUROLOGIC:                   Grossly intact.  ASSESSMENT :                  1. Intrauterine pregnancy at 9+ weeks                                  gestational age, requests termination.                               2. History of breast carcinoma.  PLAN:                         Dilation and evacuation.  Check Rh status prior to surgery.  Condom and foam use after procedure for contraception. DD:  01/20/00 TD:  01/20/00 Job: [id number]

## 2010-10-08 NOTE — Op Note (Signed)
[facility]  Patient:    Amy [name], Amy [name]                    MRN: [id number] Proc. Date: 01/21/00 Adm. Date:  [id number] Attending:  Ardeen Fillers                           Operative Report  INDICATIONS FOR PROCEDURE:    A 50 year old woman (G4, P2-0-1-2), last menstrual period November 12, 1999; admitted at 9+ weeks gestational age.  For termination at her request.  The patient has a personal history of breast carcinoma.  PREOPERATIVE DIAGNOSIS:       Intrauterine pregnancy, 9+ weeks gestational age.  Rh positive.  Request for termination.  History of breast carcinoma.  POSTOPERATIVE DIAGNOSIS:      Intrauterine pregnancy, 9+ weeks gestational age.  Rh positive.  Request for termination.  History of breast carcinoma.  PROCEDURE:                    D&E.  SURGEON:                      Sung Amabile. Roslyn Smiling, M.D.  ANESTHESIA:                   IV sedation and paracervical block.  ESTIMATED BLOOD LOSS:         80 cc.  TUBES/DRAINS:                 None.  COMPLICATIONS:                None.  FINDINGS:                     Preoperative uterine size 8-10 weeks, retroverted.  Postoperative uterine size 6-8 weeks.  Tissue obtained on curettage.  Uterine cavity regular.  SPECIMENS:                    Uterine curetting to pathology.  DESCRIPTION OF PROCEDURE:     After the establishment of IV sedation, The patient was placed in the dorsolithotomy position.  The perineum and vagina were prepped with Betadine solution.  The patient was draped.  Examination under anesthesia for the above findings was carried outs.  Bivalve speculum was inserted in the vagina.  The cervix was reprepped with Betadine solution. The anterior cervical lip was infiltrated with 1% Xylocaine.  It was then grasped with a single-tooth tenaculum.  Paracervical block was placed in the usual fashion, using 20 cc of 1% Xylocaine.  The uterine sandwich used to gently negotiate the  direction of the endocervical canal.  Pratt dilators were used to dilate the cervix to a 31-French.  A #10 suction curet was introduced easily into the uterine cavity.  Suction curettage was performed.  Tissue was obtained.  Gentle, sharp curettage was performed.  A final passage with the suction curet was made.  The uterus was felt to be empty and the cavity smooth at the end of the case.  The instruments were removed and hemostasis was noted.  The patient was returned to the supine position, transferred to the recovery room in satisfactory condition. DD:  01/21/00 TD:  01/21/00 Job: [id number]

## 2010-10-08 NOTE — Op Note (Signed)
Amy [name], Amy [name]                       ACCOUNT NO.:  [id number]   MEDICAL RECORD NO.:  [id number]                   PATIENT TYPE:  AMB   LOCATION:  [facility]                                  FACILITY:  [facility]   PHYSICIAN:  Pershing Cox, M.D.            DATE OF BIRTH:  [birth year]   DATE OF PROCEDURE:  09/05/2003  DATE OF DISCHARGE:                                 OPERATIVE REPORT   PREOPERATIVE DIAGNOSIS:  Menorrhagia and endometrial polyp on sonogram.   POSTOPERATIVE DIAGNOSIS:  Endometrial polyp.   PROCEDURES:  1. Examination under anesthesia.  2. Fractional dilatation and curettage.  3. Hysteroscopy with resection of endometrial polyp.   ANESTHESIA:  General by LMA and paracervical block.   SURGEON:  Pershing Cox, M.D.   INDICATION FOR PROCEDURE:  Amy [name] is a 50 year old woman who has  been placed on tamoxifen.  She had a history of breast cancer in her 30s and  has undergone reconstruction after mastectomy.                                               Pershing Cox, M.D.    MAJ/MEDQ  D:  09/05/2003  T:  09/05/2003  Job:  [id number]

## 2010-10-08 NOTE — Op Note (Signed)
Amy Amy [name], Amy [name]                       ACCOUNT NO.:  [id number]   MEDICAL RECORD NO.:  [id number]                   PATIENT TYPE:  AMB   LOCATION:  SDC                                  FACILITY:  [facility]   PHYSICIAN:  Amy Amy [name], M.D.            DATE OF BIRTH:  [birth year]   DATE OF PROCEDURE:  09/05/2003  DATE OF DISCHARGE:                                 OPERATIVE REPORT   PREOPERATIVE DIAGNOSIS:  Menorrhagia and endometrial polyp on  sonohysterogram.   POSTOPERATIVE DIAGNOSIS:  Endometrial polyp.   PROCEDURES:  1. Examination under anesthesia.  2. Fractional dilatation and curettage hysteroscopy with polyp resection.   ANESTHESIA:  General by LMA and paracervical block.   SURGEON:  Amy Amy [name], M.D.   INDICATION FOR PROCEDURE:  The patient is 50 years old.  She has a history  of breast cancer and two years ago at the advice of a physician from [facility]  was started on tamoxifen.  Last January when seen for annual examination,  she was having significant bleeding with pad changes every hour.  A sonogram  was performed, which showed a thickened endometrium.  She returned for a  hydrosonogram, and this showed a posterior wall polyp.  She has been  prepared several times for this surgery, which has had to be postponed.  In  order to schedule this surgery she was treated with Provera, which is  continued until the day of this procedure.   OPERATIVE FINDINGS:  A 10 cm uterine cavity, posterior wall polyp,  retroverted uterus.   PROCEDURE:  Amy Amy [name] was brought to the operating room with an IV  in place.  She received a gram of Ancef in the holding area, supine on the  OR table.  IV sedation was administered and an LMA was placed easily.  She  was then placed into Allen stirrups.  The lower abdomen, perineum, and  vagina were prepped with a solution of Hibiclens.  The bladder was drained  sterilely with a red rubber catheter.  Collecting drape was placed  beneath  her hips to measure the effluent, and the patient was draped for a vaginal  procedure.   A bivalve speculum was inserted into the vagina, cervix was visualized, and  0.25% Marcaine was injected into the anterior cervix, which was grasped with  a single-tooth tenaculum.  The bivalve speculum was removed and a weighted  vaginal speculum was used for the remainder of the case.  A Kevorkian  curette was used to obtain endocervical curettings, and the sound then  passed in a slightly retroverted position to a depth of 10 cm.  A  paracervical block was administered at 3, 4, 7, and 8 positions using a  total volume of 10 mL of this solution.  Next the cervix was dilated using  serial Pratt dilators to size 35.  The resectoscope was introduced into the  cavity and using through-and-through  sorbitol irrigation, the cavity was  visualized and photographed.  Both ostia were seen, and the polyp was  located on the posterior uterine wall.  Using a right angle wire and  coagulation and set on 110/110 blend 1, the endometrial polyp was resected  in fragments.  Portions of endometrial lining were also resected using  the resectoscope.  Curettage was performed using a small sharp curette.  All  of the walls were serially curetted and then a Meigs curette was used to  finish the curettage.  The sound was reintroduced and passed again to 10 cm.  Instruments were removed.  The patient was placed into the supine position  and taken to the PACU for recovery.                                               Amy Amy [name], M.D.    MAJ/MEDQ  D:  09/05/2003  T:  09/05/2003  Job:  [id number]

## 2010-11-15 ENCOUNTER — Encounter: Payer: Self-pay | Admitting: Cardiovascular Disease

## 2011-05-24 IMAGING — CT CT ANGIO CHEST
3 of 7 series · 19 of 36 positions shown · IV contrast (Omnipaque 300)
Comparison: Chest x-ray of 04/23/2010

CLINICAL DATA: Short of breath, chest pain

CT ANGIOGRAPHY CHEST WITH CONTRAST
TECHNIQUE: Multidetector CT imaging of the chest was performed
using the standard protocol during bolus administration of
intravenous contrast.  Multiplanar CT image reconstructions
including MIPs were obtained to evaluate the vascular anatomy.
Contrast:  80 ml Mmnipaque-OJJ

[Series 5: thins (id) / (id) · axial · 0.54mm/px · z∈[+1060,+1285]mm · 15 of 259 slices shown]
[im 17/259  lung]
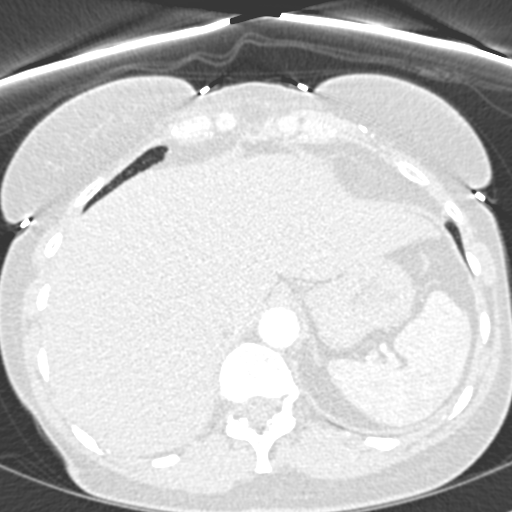
[im 33/259  mediastinal]
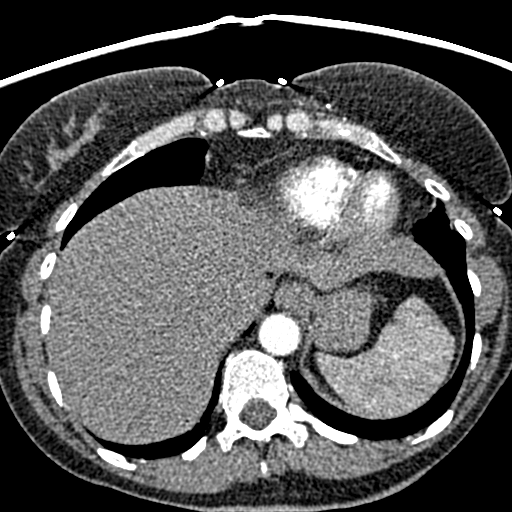
[im 49/259  lung]
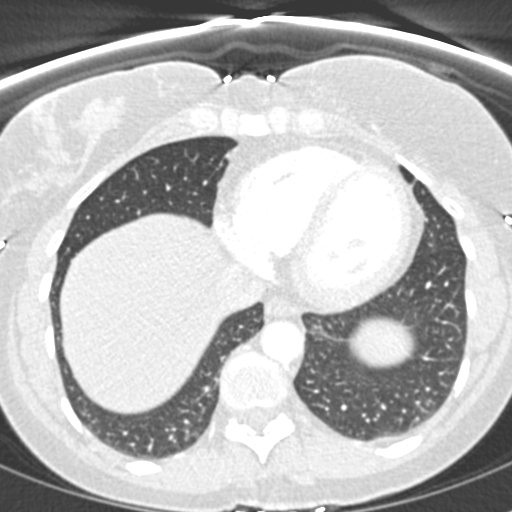
[im 65/259  mediastinal]
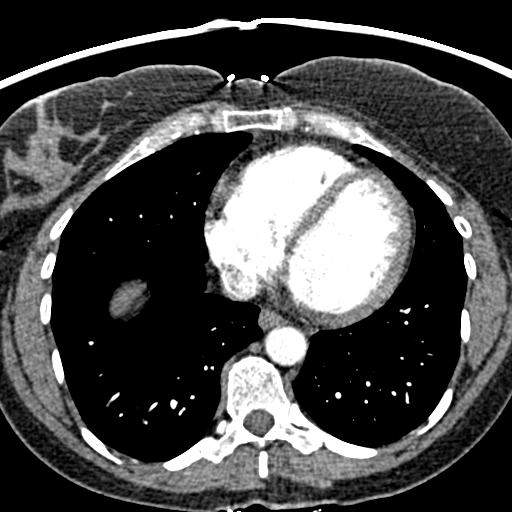
[im 81/259  lung]
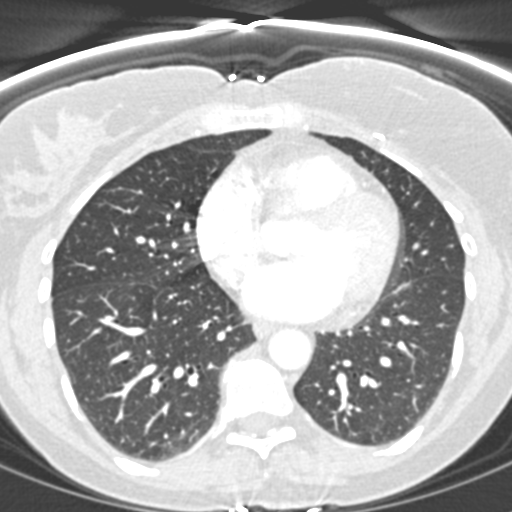
[im 97/259  mediastinal]
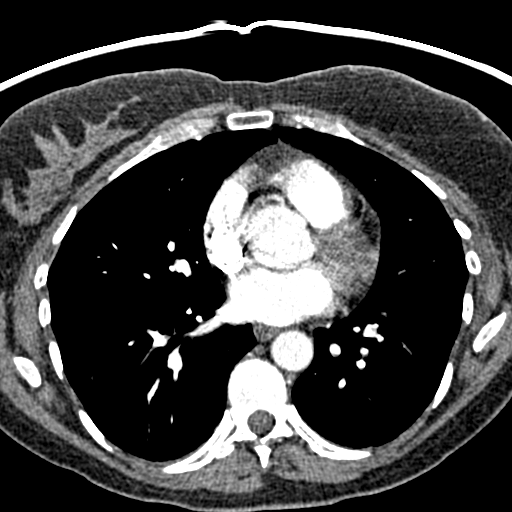
[im 113/259  lung]
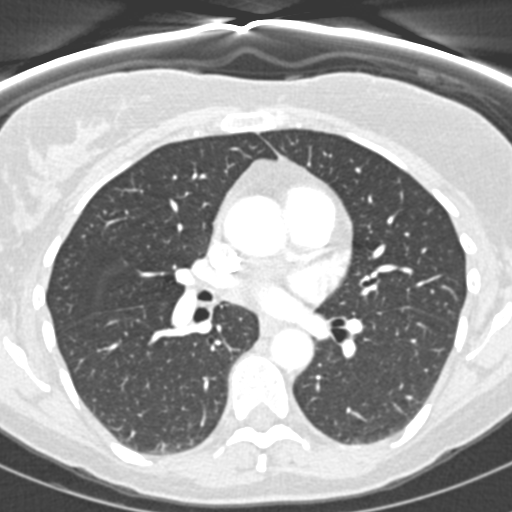
[im 130/259  mediastinal]
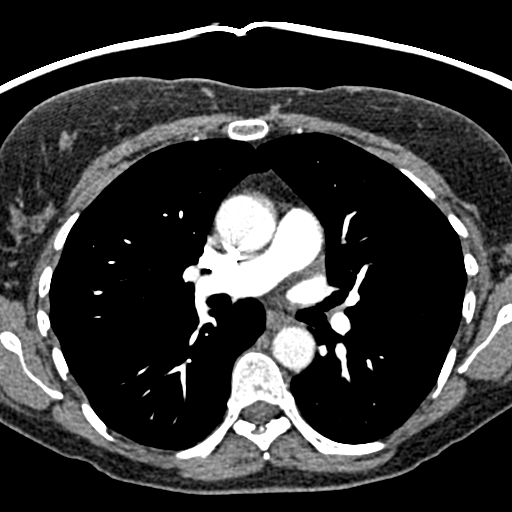
[im 146/259  lung]
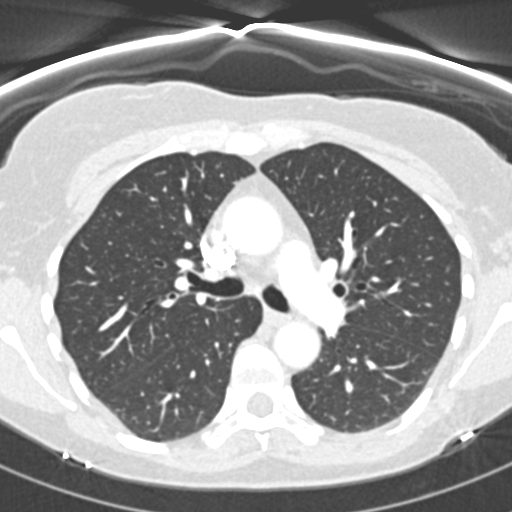
[im 162/259  mediastinal]
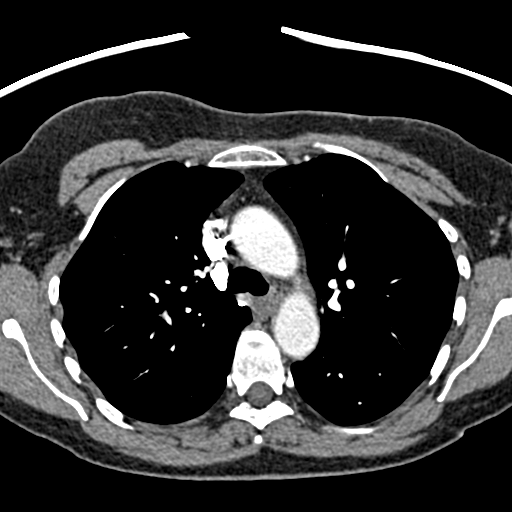
[im 178/259  lung]
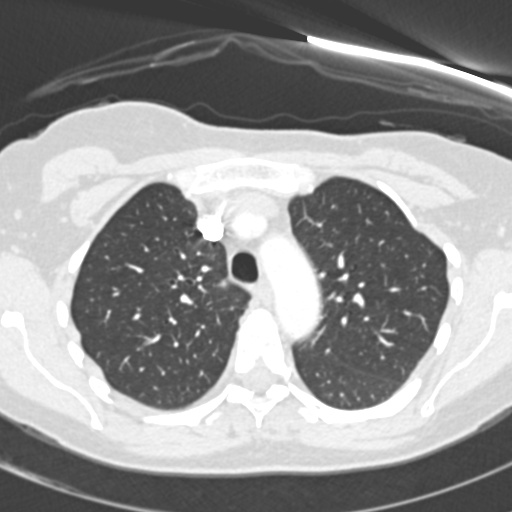
[im 194/259  mediastinal]
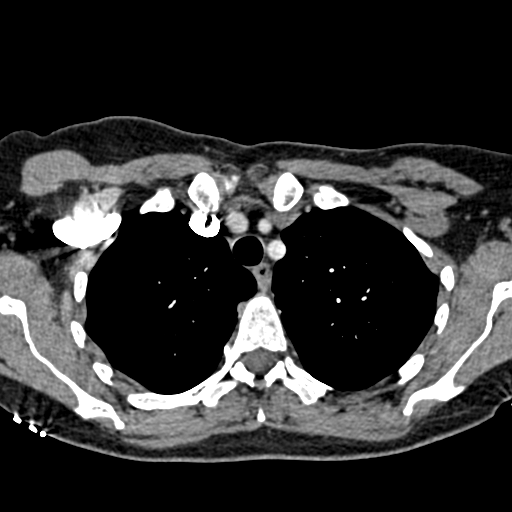
[im 210/259  lung]
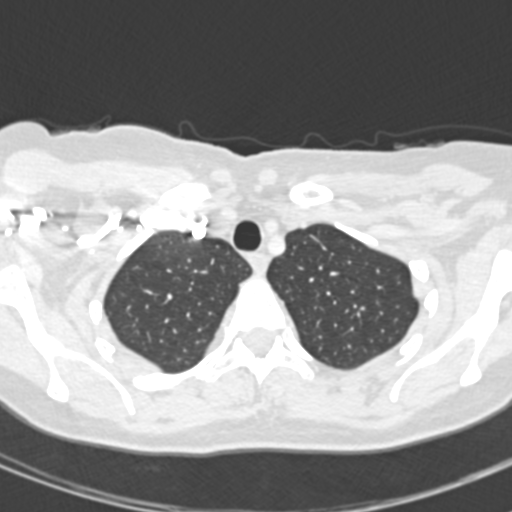
[im 226/259  mediastinal]
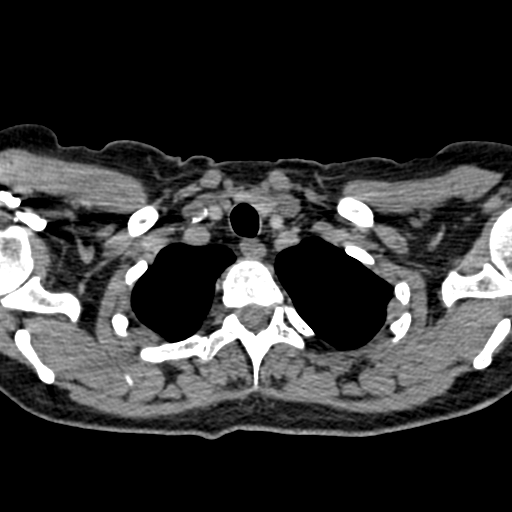
[im 242/259  lung]
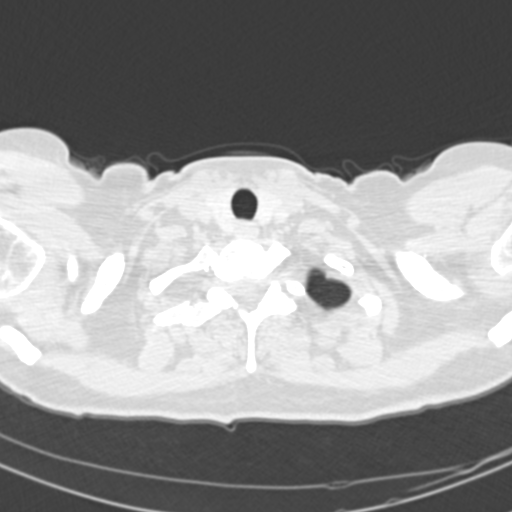

[Series 6: lung (id) / (id) · axial · 0.54mm/px · z∈[+1110,+1236]mm · 3 of 84 slices shown]
[im 21/84  mediastinal]
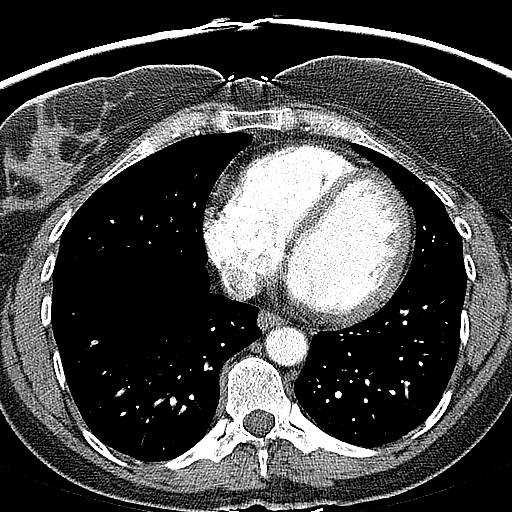
[im 42/84  mediastinal]
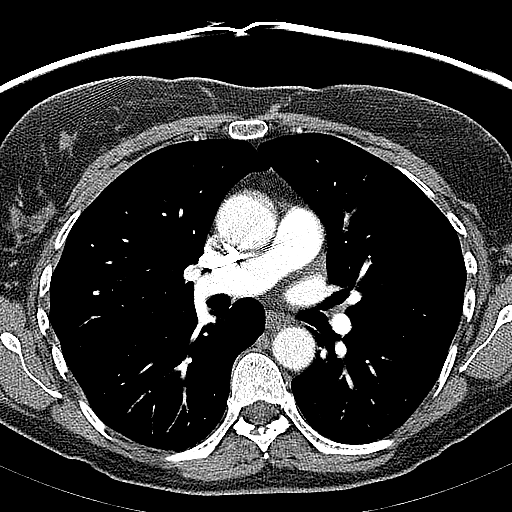
[im 63/84  mediastinal]
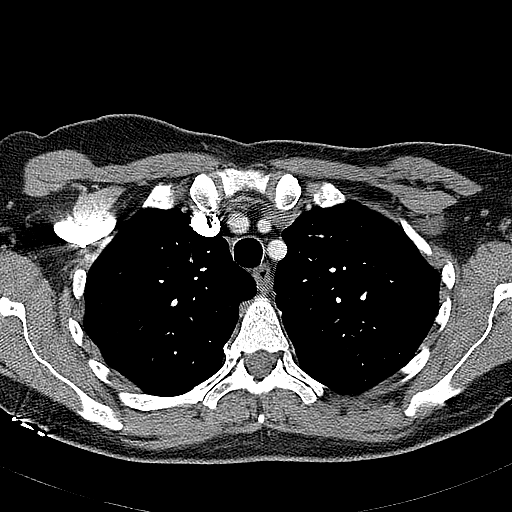

[Series 602: <mpr thick range> · coronal · 0.54mm/px · 1 of 96 slices shown]
[im 48/96  mediastinal]
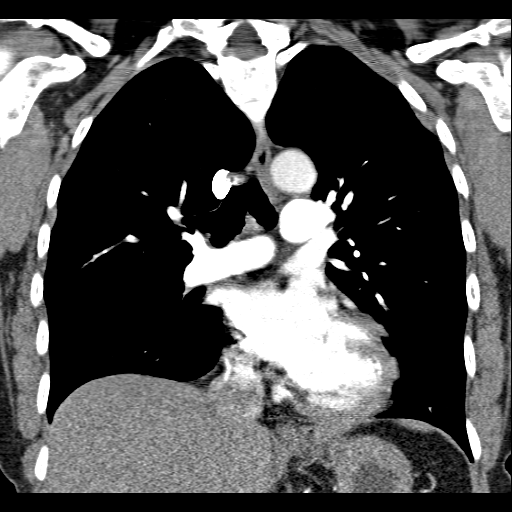

[19 of 36 positions shown; findings below may reference images not displayed]

FINDINGS: The pulmonary arteries opacify and there is no evidence
of acute pulmonary embolism.  The thoracic aorta opacifies with no
acute abnormality noted.  The origins of the great vessels are
patent.  No mediastinal or hilar adenopathy is seen.  The heart is
mildly enlarged.

On the lung movement do images, no infiltrate is seen.  No
suspicious lung nodule is seen.  No pleural effusion is noted.  No
bony abnormality is seen.

Review of the MIP images confirms the above findings.
IMPRESSION: Negative CT angio of the chest.  No evidence of acute pulmonary
embolism.  No acute process is seen.

## 2012-01-13 ENCOUNTER — Encounter (HOSPITAL_COMMUNITY): Payer: Self-pay | Admitting: *Deleted

## 2012-01-13 ENCOUNTER — Emergency Department (HOSPITAL_COMMUNITY)
Admission: EM | Admit: 2012-01-13 | Discharge: 2012-01-14 | Disposition: A | Discharge status: Home / Self Care | Payer: BC Managed Care – PPO | Attending: Emergency Medicine | Admitting: Emergency Medicine

## 2012-01-13 DIAGNOSIS — R040 Epistaxis: Secondary | ICD-10-CM | POA: Insufficient documentation

## 2012-01-13 DIAGNOSIS — R1110 Vomiting, unspecified: Secondary | ICD-10-CM | POA: Insufficient documentation

## 2012-01-13 DIAGNOSIS — R197 Diarrhea, unspecified: Secondary | ICD-10-CM | POA: Insufficient documentation

## 2012-01-13 DIAGNOSIS — Z853 Personal history of malignant neoplasm of breast: Secondary | ICD-10-CM | POA: Insufficient documentation

## 2012-01-13 DIAGNOSIS — E785 Hyperlipidemia, unspecified: Secondary | ICD-10-CM | POA: Insufficient documentation

## 2012-01-13 DIAGNOSIS — Z9010 Acquired absence of unspecified breast and nipple: Secondary | ICD-10-CM | POA: Insufficient documentation

## 2012-01-13 MED ORDER — ONDANSETRON 8 MG PO TBDP
8.0000 mg | ORAL_TABLET | Freq: Once | ORAL | Status: AC
  Administered 2012-01-13: 8 mg via ORAL
  Filled 2012-01-13: qty 1

## 2012-01-13 NOTE — ED Notes (Signed)
NWG:[id number]<ZH> Expected date:01/13/12<BR> Expected time: 9:06 PM<BR> Means of arrival:Ambulance<BR> Comments:<BR> RM 6, Epistaxis

## 2012-01-13 NOTE — ED Notes (Signed)
Per EMS, pt from home with reports of epistaxis that started around 1345 today, stopped and then started back when pt vomited at 2015, pt reports hematemesis and dark stool. Per EMS, pt vomited while moving pt to stretcher and contained blood clots but denies headache or pain and reports feeling better after vomiting.

## 2012-01-14 MED ORDER — OXYMETAZOLINE HCL 0.05 % NA SOLN
NASAL | Status: AC
  Filled 2012-01-14: qty 15

## 2012-01-14 MED ORDER — CEPHALEXIN 500 MG PO CAPS: 500.0000 mg | ORAL_CAPSULE | Freq: Four times a day (QID) | ORAL | Status: AC

## 2012-01-14 MED ORDER — OXYCODONE-ACETAMINOPHEN 5-325 MG PO TABS: ORAL_TABLET | ORAL | Status: AC

## 2012-01-14 MED ORDER — OXYCODONE-ACETAMINOPHEN 5-325 MG PO TABS
1.0000 | ORAL_TABLET | Freq: Once | ORAL | Status: AC
  Administered 2012-01-14: 1 via ORAL
  Filled 2012-01-14: qty 1

## 2012-01-14 MED ORDER — ONDANSETRON HCL 4 MG/2ML IJ SOLN
4.0000 mg | Freq: Once | INTRAMUSCULAR | Status: AC
  Administered 2012-01-14: 4 mg via INTRAVENOUS
  Filled 2012-01-14: qty 2

## 2012-01-14 NOTE — ED Provider Notes (Signed)
History     CSN: [id number]  Arrival date & time 01/13/12  2128   First MD Initiated Contact with Patient 01/14/12 0103      Chief Complaint  Patient presents with  . Epistaxis  . Emesis  . Diarrhea    HPI Pt was seen at 0115.  Per pt, c/o sudden onset and resolution of 2 episodes of left nares epistaxis since yesterday.  Pt states the first episode began approx 1345 which improved, then began again at 2015.  States both episodes she held a tissue to her nares and tilted her head back.  Endorses the "blood went down my throat" and "vomited it up" after the 2nd nosebleed.  Denies injury, no abd pain, no CP/SOB, no easy bruising/bleeding, no hx bleeding diathesis, no hx anticoagulant or antiplatelet use.        Past Medical History  Diagnosis Date  . Breast cancer   . Hyperlipidemia     Past Surgical History  Procedure Date  . G2p2   . Mastectomy 1996    no chemo, no radiation  . Tonsillectomy     Family History  Problem Relation Age of Onset  . Arthritis Mother   . Hypertension Father   . Asthma Brother     History  Substance Use Topics  . Smoking status: Former Smoker    Quit date: 05/23/1986  . Smokeless tobacco: Never Used  . Alcohol Use: Yes     Wine       Review of Systems ROS: Statement: All systems negative except as marked or noted in the HPI; Constitutional: Negative for fever and chills. ; ; Eyes: Negative for eye pain, redness and discharge. ; ; ENMT: +nosebleed. Negative for ear pain, hoarseness, nasal congestion, sinus pressure and sore throat. ; ; Cardiovascular: Negative for chest pain, palpitations, diaphoresis, dyspnea and peripheral edema. ; ; Respiratory: Negative for cough, wheezing and stridor. ; ; Gastrointestinal: +N/V. Negative for diarrhea, abdominal pain, blood in stool, jaundice and rectal bleeding. . ; ; Genitourinary: Negative for dysuria, flank pain and hematuria. ; ; Musculoskeletal: Negative for back pain and neck pain. Negative for  swelling and trauma.; ; Skin: Negative for pruritus, rash, abrasions, blisters, bruising and skin lesion.; ; Neuro: Negative for headache, lightheadedness and neck stiffness. Negative for weakness, altered level of consciousness , altered mental status, extremity weakness, paresthesias, involuntary movement, seizure and syncope.       Allergies  Review of patient's allergies indicates no known allergies.  Home Medications   Current Outpatient Rx  Name Route Sig Dispense Refill  . ASPIRIN-ACETAMINOPHEN-CAFFEINE 250-250-65 MG PO TABS Oral Take 2 tablets by mouth every 6 (six) hours as needed.    . IBUPROFEN 200 MG PO TABS Oral Take 400 mg by mouth every 6 (six) hours as needed.    . ALBUTEROL SULFATE HFA 108 (90 BASE) MCG/ACT IN AERS Inhalation Inhale into the lungs every 4 (four) hours as needed. 1-2 puffs       BP 119/72  Pulse 84  Temp 98.1 F (36.7 C) (Axillary)  Resp 22  SpO2 99%  Physical Exam 0120: Physical examination:  Nursing notes reviewed; Vital signs and O2 SAT reviewed;  Constitutional: Well developed, Well nourished, Well hydrated, In no acute distress; Head:  Normocephalic, atraumatic; Eyes: EOMI, PERRL, No scleral icterus; ENMT: +left nares anterior septum blood clot with epistaxis.  Mouth and pharynx normal, Mucous membranes moist; Neck: Supple, Full range of motion, No lymphadenopathy; Cardiovascular: Regular rate and rhythm, No  murmur, rub, or gallop; Respiratory: Breath sounds clear & equal bilaterally, No rales, rhonchi, wheezes.  Speaking full sentences with ease, Normal respiratory effort/excursion; Chest: Nontender, Movement normal; Abdomen: Soft, Nontender, Nondistended, Normal bowel sounds;; Extremities: Pulses normal, No tenderness, No edema, No calf edema or asymmetry.; Neuro: AA&Ox3, Major CN grossly intact.  Speech clear. No gross focal motor or sensory deficits in extremities.; Skin: Color normal, Warm, Dry.   ED Course  Procedures   0130:  Appears  anterior left epistaxis. Blood clot removed. Afrin sprayed and firm pressure applied to nares.  No blood in posterior pharynx.  Will continue to monitor.   0230:  Epistaxis initially resolved after afrin/pressure, but began again.  5.5 cm rapid rhino inserted into left nares with bleeding immediately improved.  No blood in posterior pharynx.  Will continue to monitor.    0430:  Pt continues without further epistaxis left nares.  No right nares epistaxis, no blood in posterior pharynx.  No vomiting or stooling while in ED.  Has tol PO without N/V.  Wants to go home now.  Dx testing d/w pt and family.  Questions answered.  Verb understanding, agreeable to d/c home with outpt f/u.    MDM  MDM Reviewed: nursing note and vitals Interpretation: labs    I-stat chem:  Hbg 14.3, HCT 42, BUN 18, Cr 0.8, Na 138, K 4.4, Cl 103, iCa 1.2, CO2 22, glu 123.            Laray Anger, DO 01/17/12 [phone]

## 2012-01-14 NOTE — ED Notes (Signed)
MD at bedside. 

## 2012-01-16 LAB — POCT I-STAT, CHEM 8
BUN: 18 mg/dL (ref 6–23)
Potassium: 4.4 mEq/L (ref 3.5–5.1)
Sodium: 138 mEq/L (ref 135–145)
TCO2: 22 mmol/L (ref 0–100)

## 2015-05-04 ENCOUNTER — Telehealth: Payer: Self-pay

## 2015-05-04 NOTE — Telephone Encounter (Signed)
-----   Message from Gatha Mayer, MD sent at 05/04/2015  9:29 AM EST ----- Regarding: call pt re: family hx  Had colonoscopy 2011 - negative  Father had colon cancer but was in 18's so that does not indicate repeat colonoscopy now  She said brothers had polyps in their 6's - could make sense to repeat one now but wanted an update on her brothers first  61) were polyps re-cancerous or any cancer? 2) did that have any surgery or just colonoscopy 3) any other family hx update?  Thanks

## 2015-05-04 NOTE — Telephone Encounter (Signed)
Left patient message to call me back to discuss family history.

## 2015-06-03 NOTE — Telephone Encounter (Signed)
Reached patient and she said she has moved to [place] and has established care there.  I will mail her our contact information to give to the new GI office so they can contact us for records per her request.

## 2015-06-03 NOTE — Telephone Encounter (Signed)
I put a copy of her last colonoscopy in the mail to her as well.
